# Patient Record
Sex: Male | Born: 2000 | Race: White | Hispanic: No | Marital: Single | Smoking: Never smoker
Health system: Southern US, Community
[De-identification: ages and names within clinical notes are randomized; demographics above are authoritative.]

---
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
## 2019-09-27 ENCOUNTER — Ambulatory Visit: Payer: BC Managed Care – PPO | Attending: Internal Medicine

## 2019-09-27 ENCOUNTER — Other Ambulatory Visit: Payer: Self-pay

## 2019-09-27 DIAGNOSIS — Z20822 Contact with and (suspected) exposure to covid-19: Secondary | ICD-10-CM | POA: Insufficient documentation

## 2019-09-28 LAB — NOVEL CORONAVIRUS, NAA: SARS-CoV-2, NAA: NOT DETECTED

## 2019-09-29 ENCOUNTER — Other Ambulatory Visit: Payer: Self-pay

## 2020-10-04 ENCOUNTER — Ambulatory Visit
Admission: RE | Admit: 2020-10-04 | Discharge: 2020-10-04 | Disposition: A | Discharge status: Home / Self Care | Payer: 59 | Source: Ambulatory Visit | Attending: Family Medicine | Admitting: Family Medicine

## 2020-10-04 ENCOUNTER — Other Ambulatory Visit: Payer: Self-pay

## 2020-10-04 VITALS — BP 124/82 | HR 88 | Temp 98.4°F | Resp 18

## 2020-10-04 DIAGNOSIS — R059 Cough, unspecified: Secondary | ICD-10-CM

## 2020-10-04 DIAGNOSIS — R062 Wheezing: Secondary | ICD-10-CM

## 2020-10-04 MED ORDER — PREDNISONE 20 MG PO TABS: 40.0000 mg | ORAL_TABLET | Freq: Every day | ORAL | 0 refills | Status: AC

## 2020-10-04 MED ORDER — BENZONATATE 100 MG PO CAPS: ORAL_CAPSULE | ORAL | 0 refills | Status: AC

## 2020-10-04 NOTE — ED Provider Notes (Signed)
  [facility]   [id number] 10/04/20 Arrival Time: 1248  ASSESSMENT & PLAN:  1. Cough   2. Wheezing    No signs of PNA. Reassured.  Meds ordered this encounter  Medications  . benzonatate (TESSALON) 100 MG capsule    Sig: Take 1 capsule by mouth every 8 (eight) hours for cough.    Dispense:  21 capsule    Refill:  0  . predniSONE (DELTASONE) 20 MG tablet    Sig: Take 2 tablets (40 mg total) by mouth daily.    Dispense:  10 tablet    Refill:  0     Follow-up Information    John Giovanni, MD.   Specialty: Family Medicine Why: As needed. Contact information: [address] [place] [address] [phone]        [facility] [place].   Specialty: Urgent Care Why: If worsening or failing to improve as anticipated. Contact information: [address] [place] [address] [phone]              Reviewed expectations re: course of current medical issues. Questions answered. Outlined signs and symptoms indicating need for more acute intervention. Understanding verbalized. After Visit Summary given.   SUBJECTIVE: History from: patient. Jerry Orozco is a 20 y.o. male who reports a dry cough; 1 week; questions starting after working in much dust a week ago. No SOB. Cough seems to be worse at night. Afebrile. Non-smoker. Denies: runny nose and headache. Normal PO intake without n/v/d.    OBJECTIVE:  Vitals:   10/04/20 1308  BP: 124/82  Pulse: 88  Resp: 18  Temp: 98.4 F (36.9 C)  TempSrc: Oral  SpO2: 97%    General appearance: alert; no distress Eyes: PERRLA; EOMI; conjunctiva normal HENT: [name]; AT; without nasal congestion Neck: supple  Lungs: speaks full sentences without difficulty; unlabored; dry cough; mild to moderate bilateral exp wheezes Extremities: no edema Skin: warm and dry Neurologic: normal gait Psychological: alert and cooperative; normal mood and  affect    Allergies  Allergen Reactions  . Penicillins     History reviewed. No pertinent past medical history. Social History   Socioeconomic History  . Marital status: Single    Spouse name: Not on file  . Number of children: Not on file  . Years of education: Not on file  . Highest education level: Not on file  Occupational History  . Not on file  Tobacco Use  . Smoking status: Never Smoker  . Smokeless tobacco: Never Used  Vaping Use  . Vaping Use: Never used  Substance and Sexual Activity  . Alcohol use: Never  . Drug use: Never  . Sexual activity: Not on file  Other Topics Concern  . Not on file  Social History Narrative  . Not on file   Social Determinants of Health   Financial Resource Strain: Not on file  Food Insecurity: Not on file  Transportation Needs: Not on file  Physical Activity: Not on file  Stress: Not on file  Social Connections: Not on file  Intimate Partner Violence: Not on file   History reviewed. No pertinent family history. History reviewed. No pertinent surgical history.   Mardella Layman, MD 10/04/20 [phone]

## 2020-10-04 NOTE — ED Triage Notes (Signed)
Nonproductive cough x 1 week.  

## 2023-07-08 DIAGNOSIS — E663 Overweight: Secondary | ICD-10-CM | POA: Diagnosis not present

## 2023-07-08 DIAGNOSIS — Z1331 Encounter for screening for depression: Secondary | ICD-10-CM | POA: Diagnosis not present

## 2023-07-08 DIAGNOSIS — Z6829 Body mass index (BMI) 29.0-29.9, adult: Secondary | ICD-10-CM | POA: Diagnosis not present

## 2023-07-08 DIAGNOSIS — Z0000 Encounter for general adult medical examination without abnormal findings: Secondary | ICD-10-CM | POA: Diagnosis not present
